# Patient Record
Sex: Female | Born: 1965 | Race: Black or African American | Hispanic: No | Marital: Married | Smoking: Never smoker
Health system: Southern US, Community
[De-identification: ages and names within clinical notes are randomized; demographics above are authoritative.]

## PROBLEM LIST (undated history)

## (undated) DIAGNOSIS — M5430 Sciatica, unspecified side: Secondary | ICD-10-CM

## (undated) DIAGNOSIS — M797 Fibromyalgia: Secondary | ICD-10-CM

## (undated) DIAGNOSIS — I10 Essential (primary) hypertension: Secondary | ICD-10-CM

## (undated) HISTORY — PX: BARIATRIC SURGERY: SHX1103

## (undated) HISTORY — PX: ABDOMINAL HYSTERECTOMY: SHX81

---

## 2017-11-02 ENCOUNTER — Other Ambulatory Visit: Payer: Self-pay | Admitting: Obstetrics and Gynecology

## 2017-11-02 DIAGNOSIS — Z1231 Encounter for screening mammogram for malignant neoplasm of breast: Secondary | ICD-10-CM

## 2018-01-10 ENCOUNTER — Ambulatory Visit
Admission: RE | Admit: 2018-01-10 | Discharge: 2018-01-10 | Disposition: A | Discharge status: Home / Self Care | Payer: BLUE CROSS/BLUE SHIELD | Source: Ambulatory Visit | Attending: Obstetrics and Gynecology | Admitting: Obstetrics and Gynecology

## 2018-01-10 ENCOUNTER — Encounter: Payer: Self-pay | Admitting: Radiology

## 2018-01-10 DIAGNOSIS — Z1231 Encounter for screening mammogram for malignant neoplasm of breast: Secondary | ICD-10-CM | POA: Diagnosis present

## 2018-01-16 ENCOUNTER — Other Ambulatory Visit: Payer: Self-pay | Admitting: *Deleted

## 2018-01-16 ENCOUNTER — Inpatient Hospital Stay
Admission: RE | Admit: 2018-01-16 | Discharge: 2018-01-16 | Disposition: A | Discharge status: Home / Self Care | Payer: Self-pay | Source: Ambulatory Visit | Attending: *Deleted | Admitting: *Deleted

## 2018-01-16 DIAGNOSIS — Z9289 Personal history of other medical treatment: Secondary | ICD-10-CM

## 2018-01-26 ENCOUNTER — Inpatient Hospital Stay
Admission: RE | Admit: 2018-01-26 | Discharge: 2018-01-26 | Disposition: A | Discharge status: Home / Self Care | Payer: Self-pay | Source: Ambulatory Visit | Attending: *Deleted | Admitting: *Deleted

## 2018-01-26 ENCOUNTER — Other Ambulatory Visit: Payer: Self-pay | Admitting: *Deleted

## 2018-01-26 DIAGNOSIS — Z9289 Personal history of other medical treatment: Secondary | ICD-10-CM

## 2018-09-22 ENCOUNTER — Emergency Department
Admission: EM | Admit: 2018-09-22 | Discharge: 2018-09-22 | Disposition: A | Discharge status: Home / Self Care | Payer: BLUE CROSS/BLUE SHIELD | Attending: Emergency Medicine | Admitting: Emergency Medicine

## 2018-09-22 ENCOUNTER — Emergency Department: Payer: BLUE CROSS/BLUE SHIELD

## 2018-09-22 ENCOUNTER — Other Ambulatory Visit: Payer: Self-pay

## 2018-09-22 DIAGNOSIS — S8992XA Unspecified injury of left lower leg, initial encounter: Secondary | ICD-10-CM | POA: Diagnosis present

## 2018-09-22 DIAGNOSIS — S8002XA Contusion of left knee, initial encounter: Secondary | ICD-10-CM | POA: Insufficient documentation

## 2018-09-22 DIAGNOSIS — Y92410 Unspecified street and highway as the place of occurrence of the external cause: Secondary | ICD-10-CM | POA: Insufficient documentation

## 2018-09-22 DIAGNOSIS — Y9389 Activity, other specified: Secondary | ICD-10-CM | POA: Diagnosis not present

## 2018-09-22 DIAGNOSIS — Y998 Other external cause status: Secondary | ICD-10-CM | POA: Insufficient documentation

## 2018-09-22 MED ORDER — NAPROXEN 500 MG PO TABS: 500.0000 mg | ORAL_TABLET | Freq: Two times a day (BID) | ORAL | Status: DC

## 2018-09-22 MED ORDER — TRAMADOL HCL 50 MG PO TABS: 50.0000 mg | ORAL_TABLET | Freq: Two times a day (BID) | ORAL | 0 refills | Status: DC | PRN
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
## 2018-09-22 NOTE — ED Notes (Signed)
See triage note. Pt sitting in bed no distress noted.

## 2018-09-22 NOTE — Discharge Instructions (Signed)
Follow discharge care instructions apply warm compresses to area 2-3 times a day for 5 to 10 minutes at a time.

## 2019-03-31 ENCOUNTER — Emergency Department
Admission: EM | Admit: 2019-03-31 | Discharge: 2019-03-31 | Disposition: A | Discharge status: Home / Self Care | Payer: BLUE CROSS/BLUE SHIELD | Attending: Emergency Medicine | Admitting: Emergency Medicine

## 2019-03-31 ENCOUNTER — Other Ambulatory Visit: Payer: Self-pay

## 2019-03-31 ENCOUNTER — Encounter: Payer: Self-pay | Admitting: Emergency Medicine

## 2019-03-31 ENCOUNTER — Emergency Department: Payer: BLUE CROSS/BLUE SHIELD

## 2019-03-31 DIAGNOSIS — Y92410 Unspecified street and highway as the place of occurrence of the external cause: Secondary | ICD-10-CM | POA: Insufficient documentation

## 2019-03-31 DIAGNOSIS — Y9389 Activity, other specified: Secondary | ICD-10-CM | POA: Insufficient documentation

## 2019-03-31 DIAGNOSIS — R0789 Other chest pain: Secondary | ICD-10-CM | POA: Diagnosis not present

## 2019-03-31 DIAGNOSIS — Y999 Unspecified external cause status: Secondary | ICD-10-CM | POA: Diagnosis not present

## 2019-03-31 DIAGNOSIS — I10 Essential (primary) hypertension: Secondary | ICD-10-CM | POA: Diagnosis not present

## 2019-03-31 DIAGNOSIS — S40012A Contusion of left shoulder, initial encounter: Secondary | ICD-10-CM | POA: Diagnosis not present

## 2019-03-31 DIAGNOSIS — Z79899 Other long term (current) drug therapy: Secondary | ICD-10-CM | POA: Insufficient documentation

## 2019-03-31 DIAGNOSIS — R079 Chest pain, unspecified: Secondary | ICD-10-CM | POA: Diagnosis present

## 2019-03-31 LAB — CBC WITH DIFFERENTIAL/PLATELET
Abs Immature Granulocytes: 0.01 10*3/uL (ref 0.00–0.07)
Basophils Absolute: 0.1 10*3/uL (ref 0.0–0.1)
Basophils Relative: 1 %
Eosinophils Absolute: 0.2 10*3/uL (ref 0.0–0.5)
Eosinophils Relative: 4 %
HCT: 41.5 % (ref 36.0–46.0)
Hemoglobin: 13.5 g/dL (ref 12.0–15.0)
Immature Granulocytes: 0 %
Lymphocytes Relative: 36 %
Lymphs Abs: 1.9 10*3/uL (ref 0.7–4.0)
MCH: 29.3 pg (ref 26.0–34.0)
MCHC: 32.5 g/dL (ref 30.0–36.0)
MCV: 90.2 fL (ref 80.0–100.0)
Monocytes Absolute: 0.3 10*3/uL (ref 0.1–1.0)
Monocytes Relative: 6 %
Neutro Abs: 2.8 10*3/uL (ref 1.7–7.7)
Neutrophils Relative %: 53 %
Platelets: 227 10*3/uL (ref 150–400)
RBC: 4.6 MIL/uL (ref 3.87–5.11)
RDW: 12.3 % (ref 11.5–15.5)
WBC: 5.3 10*3/uL (ref 4.0–10.5)
nRBC: 0 % (ref 0.0–0.2)

## 2019-03-31 LAB — BASIC METABOLIC PANEL
Anion gap: 6 (ref 5–15)
BUN: 18 mg/dL (ref 6–20)
CO2: 28 mmol/L (ref 22–32)
Calcium: 9.1 mg/dL (ref 8.9–10.3)
Chloride: 105 mmol/L (ref 98–111)
Creatinine, Ser: 0.85 mg/dL (ref 0.44–1.00)
GFR calc Af Amer: >60 mL/min (ref >60)
GFR calc non Af Amer: >60 mL/min (ref >60)
Glucose, Bld: 92 mg/dL (ref 70–99)
Potassium: 3.9 mmol/L (ref 3.5–5.1)
Sodium: 139 mmol/L (ref 135–145)

## 2019-03-31 LAB — TROPONIN I: Troponin I: <0.03 ng/mL (ref <0.03)

## 2019-03-31 MED ORDER — IBUPROFEN 600 MG PO TABS: 600.0000 mg | ORAL_TABLET | Freq: Four times a day (QID) | ORAL | 0 refills | Status: DC | PRN

## 2019-03-31 MED ORDER — KETOROLAC TROMETHAMINE 30 MG/ML IJ SOLN
15.0000 mg | Freq: Once | INTRAMUSCULAR | Status: AC
  Administered 2019-03-31: 15 mg via INTRAVENOUS
  Filled 2019-03-31: qty 1

## 2019-03-31 MED ORDER — CYCLOBENZAPRINE HCL 5 MG PO TABS: 5.0000 mg | ORAL_TABLET | Freq: Three times a day (TID) | ORAL | 0 refills | Status: DC | PRN

## 2019-03-31 MED ORDER — CYCLOBENZAPRINE HCL 10 MG PO TABS
5.0000 mg | ORAL_TABLET | Freq: Once | ORAL | Status: AC
  Administered 2019-03-31: 5 mg via ORAL
  Filled 2019-03-31: qty 1
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
## 2019-03-31 NOTE — ED Notes (Signed)
Patient discharged home, paperwork and prescriptions reviewed with questions answered. Patient ambulatory to husband's vehicle.

## 2020-01-22 ENCOUNTER — Ambulatory Visit: Payer: Self-pay | Attending: Oncology

## 2020-01-22 ENCOUNTER — Other Ambulatory Visit: Payer: Self-pay

## 2020-01-22 ENCOUNTER — Ambulatory Visit
Admission: RE | Admit: 2020-01-22 | Discharge: 2020-01-22 | Disposition: A | Discharge status: Home / Self Care | Payer: Self-pay | Source: Ambulatory Visit | Attending: Oncology | Admitting: Oncology

## 2020-01-22 VITALS — BP 108/74 | HR 68 | Temp 99.1°F | Ht 64.5 in | Wt 268.0 lb

## 2020-01-22 DIAGNOSIS — Z0000 Encounter for general adult medical examination without abnormal findings: Secondary | ICD-10-CM | POA: Insufficient documentation

## 2020-01-22 NOTE — Progress Notes (Signed)
Letter mailed from [facility] to notify of normal mammogram results.  Patient to return in one year for annual screening.  Copy to [facility]. 

## 2020-01-22 NOTE — Progress Notes (Signed)
  Subjective:     Patient ID: Patricia Lewis, female   DOB: Jul 08, 1966, 54 y.o.   MRN: [id number]  HPI   Review of Systems     Objective:   Physical Exam Chest:     Breasts: Breasts are asymmetrical.        Right: No swelling, bleeding, inverted nipple, mass, nipple discharge, skin change or tenderness.        Left: No swelling, bleeding, inverted nipple, mass, nipple discharge, skin change or tenderness.     Comments: Right breast smaller than left       Assessment:     54 year old patient presents for [facility] clinic visit.  Patient screened, and meets [facility] eligibility.  Patient does not have insurance, Medicare or Medicaid. Instructed patient on breast self awareness using teach back method.  Clinical breast exam unremarkable..  No mass or lump palpated.  Reviewed 2018 negative /negative pap results in Care Everywhere  with patient .  Risk Assessment    Risk Scores      01/22/2020   Last edited by: Scarlett Presto, RN   5-year risk: 1.2 %   Lifetime risk: 8.5 %            Plan:     Sent for bilateral screening mammogram.

## 2020-04-18 ENCOUNTER — Other Ambulatory Visit: Payer: Self-pay

## 2020-04-18 ENCOUNTER — Ambulatory Visit (INDEPENDENT_AMBULATORY_CARE_PROVIDER_SITE_OTHER): Payer: PRIVATE HEALTH INSURANCE

## 2020-04-18 ENCOUNTER — Ambulatory Visit: Payer: PRIVATE HEALTH INSURANCE

## 2020-04-18 ENCOUNTER — Ambulatory Visit
Admission: EM | Admit: 2020-04-18 | Discharge: 2020-04-18 | Disposition: A | Discharge status: Home / Self Care | Payer: PRIVATE HEALTH INSURANCE

## 2020-04-18 ENCOUNTER — Encounter: Payer: Self-pay | Admitting: Emergency Medicine

## 2020-04-18 DIAGNOSIS — M25562 Pain in left knee: Secondary | ICD-10-CM

## 2020-04-18 HISTORY — DX: Essential (primary) hypertension: I10

## 2020-04-18 HISTORY — DX: Fibromyalgia: M79.7

## 2020-04-18 HISTORY — DX: Sciatica, unspecified side: M54.30

## 2020-04-18 MED ORDER — LIDOCAINE HCL (PF) 1 % IJ SOLN
5.0000 mL | Freq: Once | INTRAMUSCULAR | Status: AC
  Administered 2020-04-18: 20:00:00 5 mL

## 2020-04-18 MED ORDER — TRIAMCINOLONE ACETONIDE 40 MG/ML IJ SUSP
40.0000 mg | Freq: Once | INTRAMUSCULAR | Status: AC
  Administered 2020-04-18: 40 mg via INTRA_ARTICULAR

## 2020-04-18 MED ORDER — TRAMADOL HCL 50 MG PO TABS: 50.0000 mg | ORAL_TABLET | Freq: Four times a day (QID) | ORAL | 0 refills | Status: AC | PRN

## 2020-04-18 MED ORDER — MELOXICAM 15 MG PO TABS: 15.0000 mg | ORAL_TABLET | Freq: Every day | ORAL | 0 refills | Status: AC
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
## 2020-04-18 NOTE — ED Triage Notes (Signed)
Patient c/o left knee pain and swelling off and on for a week.  Patient states she has noticed some redness in her left knee.  Patient denies injury or fall.

## 2020-04-18 NOTE — Discharge Instructions (Addendum)
Take the meloxicam every day  You may take ibuprofen OR aleve as needed for pain in addition to the tramadol  You may feel a little more pain, swelling, and warmth than you did before the injection, which may last about 1-2 days. It is important to ice your knee for 20 minutes, 2-3 times per day for the next couple of days  Wear the knee sleeve when up for comfort. You may remove it at night and when bathing.  Follow-up with orthopedics. Call on Monday to make appointment. You may contact the one suggested by [place] here or choose your own physician.   It was my absolute pleasure taking care of you today!!!  I hope you feel better soon Lelon Mast, FNP-C

## 2020-11-09 ENCOUNTER — Other Ambulatory Visit: Payer: Self-pay

## 2020-11-09 ENCOUNTER — Emergency Department
Admission: EM | Admit: 2020-11-09 | Discharge: 2020-11-09 | Disposition: A | Discharge status: Home / Self Care | Payer: PRIVATE HEALTH INSURANCE | Attending: Emergency Medicine | Admitting: Emergency Medicine

## 2020-11-09 ENCOUNTER — Emergency Department: Payer: PRIVATE HEALTH INSURANCE

## 2020-11-09 ENCOUNTER — Encounter: Payer: Self-pay | Admitting: Emergency Medicine

## 2020-11-09 DIAGNOSIS — Z79899 Other long term (current) drug therapy: Secondary | ICD-10-CM | POA: Insufficient documentation

## 2020-11-09 DIAGNOSIS — R079 Chest pain, unspecified: Secondary | ICD-10-CM | POA: Diagnosis present

## 2020-11-09 DIAGNOSIS — I10 Essential (primary) hypertension: Secondary | ICD-10-CM | POA: Diagnosis not present

## 2020-11-09 DIAGNOSIS — R0602 Shortness of breath: Secondary | ICD-10-CM | POA: Diagnosis not present

## 2020-11-09 DIAGNOSIS — R0789 Other chest pain: Secondary | ICD-10-CM | POA: Diagnosis not present

## 2020-11-09 LAB — BASIC METABOLIC PANEL
Anion gap: 8 (ref 5–15)
BUN: 18 mg/dL (ref 6–20)
CO2: 27 mmol/L (ref 22–32)
Calcium: 9.3 mg/dL (ref 8.9–10.3)
Chloride: 104 mmol/L (ref 98–111)
Creatinine, Ser: 0.89 mg/dL (ref 0.44–1.00)
GFR, Estimated: >60 mL/min (ref >60)
Glucose, Bld: 98 mg/dL (ref 70–99)
Potassium: 3.5 mmol/L (ref 3.5–5.1)
Sodium: 139 mmol/L (ref 135–145)

## 2020-11-09 LAB — CBC
HCT: 43.7 % (ref 36.0–46.0)
Hemoglobin: 14.1 g/dL (ref 12.0–15.0)
MCH: 29.1 pg (ref 26.0–34.0)
MCHC: 32.3 g/dL (ref 30.0–36.0)
MCV: 90.1 fL (ref 80.0–100.0)
Platelets: 235 10*3/uL (ref 150–400)
RBC: 4.85 MIL/uL (ref 3.87–5.11)
RDW: 12.7 % (ref 11.5–15.5)
WBC: 5.5 10*3/uL (ref 4.0–10.5)
nRBC: 0 % (ref 0.0–0.2)

## 2020-11-09 LAB — TROPONIN I (HIGH SENSITIVITY)
Troponin I (High Sensitivity): <2 ng/L (ref <18)
Troponin I (High Sensitivity): <2 ng/L (ref <18)

## 2020-11-09 NOTE — ED Triage Notes (Signed)
Pt reports intermittent sharp chest pain that started last pm to her mid to left chest. Pt reports some SOB with the pain

## 2020-11-09 NOTE — Discharge Instructions (Signed)
Please take Tylenol and ibuprofen/Advil for your pain.  It is safe to take them together, or to alternate them every few hours.  Take up to 1000mg  of Tylenol at a time, up to 4 times per day.  Do not take more than 4000 mg of Tylenol in 24 hours.  For ibuprofen, take 400-600 mg, 4-5 times per day.  As we discussed, there is no evidence of strain or damage to your heart.  Your pain is likely due to the lifting you were doing yesterday causing some tightness.  If you develop any worsening chest pains, particularly in combination with fevers or passing out, please return to the ED.

## 2020-11-09 NOTE — ED Provider Notes (Signed)
[facility] Emergency Department Provider Note ____________________________________________   First MD Initiated Contact with Patient 11/09/20 1455     (approximate)  I have reviewed the triage vital signs and the nursing notes.  HISTORY  Chief Complaint Chest Pain and Shortness of Breath   HPI Patricia Lewis is a 54 y.o. femalewho presents to the ED for evaluation of chest pain shortness of breath.  Chart review indicates history of hypertension and fibromyalgia.  Patient reports that she has never seen a cardiologist and has no cardiac medical history.  Denies smoking cigarettes.  No family history of early cardiac death.  She reports being in her typical state of health until developing sharp left-sided chest pains that occurred last night.  She reports the pain was up to 6/10 intensity, sharp in nature and nonradiating.  Lasting a matter of minutes before self resolving has not recurred.  She further reports soreness to the full length of the flexor surface of her left arm that is been constant and present for the past 12 hours straight, 4/10 intensity.  She reports doing work around the house yesterday and lifting heavy portion of her fireplace insert.  She denies any chest pain or symptoms with exertion while doing this housework.  She denies any chest pain right now says she feels better.  She has not taken any medications prior to arrival.  No fevers, syncopal episodes, shortness of breath, emesis or diaphoresis.  Past Medical History:  Diagnosis Date  . Fibromyalgia   . Hypertension   . Sciatica     There are no problems to display for this patient.   Past Surgical History:  Procedure Laterality Date  . ABDOMINAL HYSTERECTOMY      Prior to Admission medications   Medication Sig Start Date End Date Taking? Authorizing Provider  amitriptyline (ELAVIL) 25 MG tablet  06/24/18   [provider]  DULoxetine (CYMBALTA) 30 MG capsule  04/09/20    [provider]  gabapentin (NEURONTIN) 300 MG capsule Take by mouth. 09/13/18   [provider]  hydrochlorothiazide (MICROZIDE) 12.5 MG capsule Take 12.5 mg by mouth daily. 11/06/19   [provider]  ibuprofen (ADVIL) 600 MG tablet Take 1 tablet (600 mg total) by mouth every 6 (six) hours as needed. 03/31/19   Nita Sickle, MD  naproxen (NAPROSYN) 500 MG tablet Take 1 tablet (500 mg total) by mouth 2 (two) times daily with a meal. 09/22/18   Joni Reining, PA-C  phentermine 37.5 MG capsule Take 37.5 mg by mouth every morning.    [provider]  tiZANidine (ZANAFLEX) 4 MG tablet Take 4 mg by mouth 3 (three) times daily. 02/12/20   [provider]  traMADol (ULTRAM) 50 MG tablet Take 1 tablet (50 mg total) by mouth every 6 (six) hours as needed. 04/18/20   Lurline Idol, FNP    Allergies Avocado  Family History  Problem Relation Age of Onset  . Breast cancer Maternal Aunt 40  . Hypertension Mother   . Cancer Father     Social History Social History   Tobacco Use  . Smoking status: Never Smoker  . Smokeless tobacco: Never Used  Vaping Use  . Vaping Use: Never used  Substance Use Topics  . Alcohol use: Never  . Drug use: Never    Review of Systems  Constitutional: No fever/chills Eyes: No visual changes. ENT: No sore throat. Cardiovascular: Positive for chest pain. Respiratory: Denies shortness of breath. Gastrointestinal: No abdominal  pain.  No nausea, no vomiting.  No diarrhea.  No constipation. Genitourinary: Negative for dysuria. Musculoskeletal: Negative for back pain.  Positive for left arm soreness. Skin: Negative for rash. Neurological: Negative for headaches, focal weakness or numbness.  ____________________________________________   PHYSICAL EXAM:  VITAL SIGNS: Vitals:   11/09/20 1527 11/09/20 1737  BP: 135/78 (!) 144/78  Pulse: 65 88  Resp: 17 17  Temp: 98.6 F (37 C) 98 F (36.7 C)  SpO2: 100%  98%     Constitutional: Alert and oriented. Well appearing and in no acute distress. Eyes: Conjunctivae are normal. PERRL. EOMI. Head: Atraumatic. Nose: No congestion/rhinnorhea. Mouth/Throat: Mucous membranes are moist.  Oropharynx non-erythematous. Neck: No stridor. No cervical spine tenderness to palpation. Cardiovascular: Normal rate, regular rhythm. Grossly normal heart sounds.  Good peripheral circulation. Respiratory: Normal respiratory effort.  No retractions. Lungs CTAB. Gastrointestinal: Soft , nondistended, nontender to palpation. No CVA tenderness. Musculoskeletal: No lower extremity tenderness nor edema.  No joint effusions. No signs of acute trauma. Flexion against resistance to the left upper extremity reproduces her aching pain.  No swelling, signs of trauma, induration or fluctuance noted throughout the left upper extremity. Neurologic:  Normal speech and language. No gross focal neurologic deficits are appreciated. No gait instability noted. Skin:  Skin is warm, dry and intact. No rash noted. Psychiatric: Mood and affect are normal. Speech and behavior are normal.  ____________________________________________   LABS (all labs ordered are listed, but only abnormal results are displayed)  Labs Reviewed  BASIC METABOLIC PANEL  CBC  POC URINE PREG, ED  TROPONIN I (HIGH SENSITIVITY)  TROPONIN I (HIGH SENSITIVITY)   ____________________________________________  12 Lead EKG  Sinus rhythm, rate of 61 bpm.  Normal axis and intervals.  No evidence of acute ischemia. ____________________________________________  RADIOLOGY  ED MD interpretation: 2 view CXR reviewed by me without evidence of acute cardiopulmonary pathology.  Official radiology report(s): DG Chest 2 View  Result Date: 11/09/2020 CLINICAL DATA:  Chest pain. EXAM: CHEST - 2 VIEW COMPARISON:  March 31, 2019 FINDINGS: Cardiomediastinal silhouette is normal. Mediastinal contours appear intact. There is no  evidence of focal airspace consolidation, pleural effusion or pneumothorax. Osseous structures are without acute abnormality. Soft tissues are grossly normal. IMPRESSION: No active cardiopulmonary disease. Electronically Signed   By: Ted Mcalpine M.D.   On: 11/09/2020 14:56    ____________________________________________   PROCEDURES and INTERVENTIONS  Procedure(s) performed (including Critical Care):  .1-3 Lead EKG Interpretation Performed by: Delton Prairie, MD Authorized by: Delton Prairie, MD     Interpretation: normal     ECG rate:  64   ECG rate assessment: normal     Rhythm: sinus rhythm     Ectopy: none     Conduction: normal      Medications - No data to display  ____________________________________________   MDM / ED COURSE   Low risk 54 year old patient presents to the ED with intermittent chest pain that occurred last night, without evidence of cardiac ischemia, and amenable to outpatient management.  Normal vitals on room air.  Exam reassuring without evidence of acute derangements.  She is in no acute distress, has no evidence of neurovascular deficits or signs of trauma.  Work-up is quite reassuring without evidence of any acute derangements.  Her EKG is nonischemic and has negative high-sensitivity troponin x2.  CXR without infiltrate or PTX.  Continues to be chest pain-free here in the ED.  We discussed return precautions for the ED and patient is medically  stable for discharge home.  Clinical Course as of Nov 09 1741  Wynelle Link Nov 09, 2020  1724 Reassessed.  Patient continues to be chest pain-free.  We discussed reassuring work-up and outpatient management.  We discussed return precautions for the ED.   [DS]    Clinical Course User Index [DS] Delton Prairie, MD    ____________________________________________   FINAL CLINICAL IMPRESSION(S) / ED DIAGNOSES  Final diagnoses:  Other chest pain     ED Discharge Orders    None       Goldie Tregoning  Katrinka Blazing   Note:  This document was prepared using Dragon voice recognition software and may include unintentional dictation errors.   Delton Prairie, MD 11/09/20 1743

## 2021-02-06 ENCOUNTER — Other Ambulatory Visit: Payer: Self-pay | Admitting: Family Medicine

## 2021-02-06 DIAGNOSIS — Z1231 Encounter for screening mammogram for malignant neoplasm of breast: Secondary | ICD-10-CM

## 2021-02-19 ENCOUNTER — Ambulatory Visit: Payer: PRIVATE HEALTH INSURANCE

## 2021-03-05 ENCOUNTER — Ambulatory Visit
Admission: RE | Admit: 2021-03-05 | Discharge: 2021-03-05 | Disposition: A | Discharge status: Home / Self Care | Payer: Commercial Managed Care - HMO | Source: Ambulatory Visit | Attending: Family Medicine | Admitting: Family Medicine

## 2021-03-05 ENCOUNTER — Other Ambulatory Visit: Payer: Self-pay

## 2021-03-05 DIAGNOSIS — Z1231 Encounter for screening mammogram for malignant neoplasm of breast: Secondary | ICD-10-CM | POA: Diagnosis not present

## 2021-06-29 ENCOUNTER — Emergency Department
Admission: EM | Admit: 2021-06-29 | Discharge: 2021-06-29 | Disposition: A | Discharge status: Home / Self Care | Payer: Commercial Managed Care - HMO | Attending: Emergency Medicine | Admitting: Emergency Medicine

## 2021-06-29 ENCOUNTER — Emergency Department: Payer: Commercial Managed Care - HMO

## 2021-06-29 ENCOUNTER — Other Ambulatory Visit: Payer: Self-pay

## 2021-06-29 DIAGNOSIS — M79662 Pain in left lower leg: Secondary | ICD-10-CM | POA: Diagnosis present

## 2021-06-29 DIAGNOSIS — Z79899 Other long term (current) drug therapy: Secondary | ICD-10-CM | POA: Insufficient documentation

## 2021-06-29 DIAGNOSIS — I10 Essential (primary) hypertension: Secondary | ICD-10-CM | POA: Insufficient documentation

## 2021-06-29 DIAGNOSIS — L819 Disorder of pigmentation, unspecified: Secondary | ICD-10-CM | POA: Insufficient documentation

## 2021-06-29 MED ORDER — BETAMETHASONE DIPROPIONATE 0.05 % EX CREA: TOPICAL_CREAM | Freq: Two times a day (BID) | CUTANEOUS | 0 refills | Status: AC

## 2021-06-29 NOTE — ED Triage Notes (Signed)
Pt had bariatric surgery 8 weeks ago and soon after noted some lines of discoloration to left lower leg that have since worsened. Denies any pain or swelling.

## 2021-06-29 NOTE — ED Notes (Signed)
Spoke with Dr. Darnelle Catalan and verbal order given for [place].

## 2021-06-29 NOTE — Discharge Instructions (Addendum)
As we discussed, try using compression stockings and steroid cream to this area and follow-up with your PCP.  No signs of blood clots to this leg.

## 2021-06-29 NOTE — ED Provider Notes (Signed)
[facility] Emergency Department Provider Note ____________________________________________   Event Date/Time   First MD Initiated Contact with Patient 06/29/21 2042     (approximate)  I have reviewed the triage vital signs and the nursing notes.  HISTORY  Chief Complaint Leg Pain   HPI Patricia Lewis is a 55 y.o. femalewho presents to the ED for evaluation of skin discoloration.  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  Patient presents to the ED for evaluation of discoloration of the skin to her left lower leg.  She denies any pain or itching to this area, denies any trauma or injuries.  She reports this has been present since she left the hospital nearly 7 or 8 weeks ago.  She reports that it seems a little bit bigger or darker now and just wanted to get it checked out.  Denies any symptoms to the area and says it just looks strange.  Past Medical History:  Diagnosis Date   Fibromyalgia    Hypertension    Sciatica     There are no problems to display for this patient.   Past Surgical History:  Procedure Laterality Date   ABDOMINAL HYSTERECTOMY      Prior to Admission medications   Medication Sig Start Date End Date Taking? Authorizing Provider  betamethasone dipropionate 0.05 % cream Apply topically 2 (two) times daily. 06/29/21  Yes Delton Prairie, MD  amitriptyline (ELAVIL) 25 MG tablet  06/24/18   [provider]  DULoxetine (CYMBALTA) 30 MG capsule  04/09/20   [provider]  gabapentin (NEURONTIN) 300 MG capsule Take by mouth. 09/13/18   [provider]  hydrochlorothiazide (MICROZIDE) 12.5 MG capsule Take 12.5 mg by mouth daily. 11/06/19   [provider]  ibuprofen (ADVIL) 600 MG tablet Take 1 tablet (600 mg total) by mouth every 6 (six) hours as needed. 03/31/19   Nita Sickle, MD  naproxen (NAPROSYN)  500 MG tablet Take 1 tablet (500 mg total) by mouth 2 (two) times daily with a meal. 09/22/18   Joni Reining, PA-C  phentermine 37.5 MG capsule Take 37.5 mg by mouth every morning.    [provider]  tiZANidine (ZANAFLEX) 4 MG tablet Take 4 mg by mouth 3 (three) times daily. 02/12/20   [provider]  traMADol (ULTRAM) 50 MG tablet Take 1 tablet (50 mg total) by mouth every 6 (six) hours as needed. 04/18/20   Lurline Idol, FNP    Allergies Avocado  Family History  Problem Relation Age of Onset   Breast cancer Maternal Aunt 37   Hypertension Mother    Cancer Father     Social History Social History   Tobacco Use   Smoking status: Never   Smokeless tobacco: Never  Vaping Use   Vaping Use: Never used  Substance Use Topics   Alcohol use: Never   Drug use: Never    Review of Systems  Constitutional: No fever/chills Eyes: No visual changes. ENT: No sore throat. Cardiovascular: Denies chest pain. Respiratory: Denies shortness of breath. Gastrointestinal: No abdominal pain.  No nausea, no vomiting.  No diarrhea.  No constipation. Genitourinary: Negative for dysuria. Musculoskeletal: Negative for back pain. Skin: Positive skin discoloration Neurological: Negative for headaches, focal weakness or numbness.   ____________________________________________   PHYSICAL EXAM:  VITAL SIGNS: Vitals:   06/29/21 2026 06/29/21 2150  BP: 115/64 125/72  Pulse: (!) 54 (!) 55  Resp: 20 18  Temp: 98.4 F (36.9 C)   SpO2: 99% 98%     Constitutional: Alert and oriented. Well appearing and in no acute distress.  Obese.  Pleasant and conversational in full sentences. Eyes: Conjunctivae are normal. PERRL. EOMI. Head: Atraumatic. Nose: No congestion/rhinnorhea. Mouth/Throat: Mucous membranes are moist.  Oropharynx non-erythematous. Neck: No stridor. No cervical spine tenderness to palpation. Cardiovascular: Normal rate, regular rhythm. Grossly normal heart  sounds.  Good peripheral circulation. Respiratory: Normal respiratory effort.  No retractions. Lungs CTAB. Gastrointestinal: Soft , nondistended, nontender to palpation. No CVA tenderness. Musculoskeletal: No lower extremity tenderness nor edema.  No joint effusions. No signs of acute trauma. Neurologic:  Normal speech and language. No gross focal neurologic deficits are appreciated. No gait instability noted. Skin:  Skin is warm, dry and intact.   Flat discoloration to the medial aspect of the left lower leg as pictured below.  Wylene Men.  Appears like flat bruising.  Nontender to palpation.  No excoriations, lacerations, signs of discrete trauma.  No calf tenderness in the calf is soft.  Nothing extends beyond the ankle or proximally beyond the knee.  Psychiatric: Mood and affect are normal. Speech and behavior are normal.    ____________________________________________   LABS (all labs ordered are listed, but only abnormal results are displayed)  Labs Reviewed - No data to display ____________________________________________  12 Lead EKG   ____________________________________________  RADIOLOGY  ED MD interpretation:    Official radiology report(s): US Venous Img Lower Unilateral Left  Result Date: 06/29/2021 CLINICAL DATA:  Left lower extremity discoloration EXAM: LEFT LOWER EXTREMITY VENOUS DOPPLER ULTRASOUND TECHNIQUE: Gray-scale sonography with compression, as well as color and duplex ultrasound, were performed to evaluate the deep venous system(s) from the level of the common femoral vein through the popliteal and proximal calf veins. COMPARISON:  None. FINDINGS: VENOUS Normal compressibility of the common femoral, superficial femoral, and popliteal veins, as well as the visualized calf veins. Visualized portions of profunda femoral vein and great saphenous vein unremarkable. No filling defects to suggest DVT on grayscale or color Doppler imaging. Doppler waveforms show normal  direction of venous flow, normal respiratory plasticity and response to augmentation. Limited views of the contralateral common femoral vein are unremarkable. OTHER Limited sonographic images of the soft tissues in the region of discoloration demonstrate no abnormal subcutaneous soft tissue mass, fluid collection, or calcification. Limitations: none IMPRESSION: Negative. Electronically Signed   By: Helyn Numbers MD   On: 06/29/2021 21:41    ____________________________________________   PROCEDURES and INTERVENTIONS  Procedure(s) performed (including Critical Care):  Procedures  Medications - No data to display  ____________________________________________   MDM / ED COURSE   Obese 55 year old female presents to the ED with skin discoloration of uncertain etiology, amenable to outpatient management.  Normal vitals.  Patient clinically looks well.  She has discolored skin that looks similar to a bruise, but has lacy distribution.  Ultrasound demonstrates no evidence of DVT, cobblestoning to suggest cellulitis or underlying abscess.  She has no symptoms to this area such as pain or pruritus.  Uncertain etiology of the symptoms, and I discussed with the patient a trial of outpatient management with conservative measures such as compression stockings, steroid cream and following up with her PCP.  Return precautions were discussed.      ____________________________________________   FINAL CLINICAL IMPRESSION(S) / ED DIAGNOSES  Final diagnoses:  Discoloration of skin of lower leg     ED Discharge Orders  Ordered    betamethasone dipropionate 0.05 % cream  2 times daily        06/29/21 2216             Amaurie Schreckengost   Note:  This document was prepared using Conservation officer, historic buildings and may include unintentional dictation errors.    Delton Prairie, MD 06/29/21 2222

## 2021-06-30 ENCOUNTER — Ambulatory Visit: Payer: Commercial Managed Care - HMO

## 2021-12-24 DIAGNOSIS — R1013 Epigastric pain: Secondary | ICD-10-CM | POA: Diagnosis not present

## 2022-02-05 DIAGNOSIS — Z713 Dietary counseling and surveillance: Secondary | ICD-10-CM | POA: Diagnosis not present

## 2022-02-05 DIAGNOSIS — Z9884 Bariatric surgery status: Secondary | ICD-10-CM | POA: Diagnosis not present

## 2022-02-05 DIAGNOSIS — Z6832 Body mass index (BMI) 32.0-32.9, adult: Secondary | ICD-10-CM | POA: Diagnosis not present

## 2022-02-05 DIAGNOSIS — K828 Other specified diseases of gallbladder: Secondary | ICD-10-CM | POA: Diagnosis not present

## 2022-03-02 ENCOUNTER — Other Ambulatory Visit: Payer: Self-pay | Admitting: Family Medicine

## 2022-03-02 DIAGNOSIS — Z1231 Encounter for screening mammogram for malignant neoplasm of breast: Secondary | ICD-10-CM

## 2022-03-03 ENCOUNTER — Ambulatory Visit: Payer: Commercial Managed Care - HMO

## 2022-03-17 ENCOUNTER — Ambulatory Visit: Payer: Commercial Managed Care - PPO

## 2022-03-17 ENCOUNTER — Ambulatory Visit: Payer: Self-pay

## 2022-03-18 ENCOUNTER — Telehealth: Payer: Self-pay

## 2022-03-18 ENCOUNTER — Ambulatory Visit (INDEPENDENT_AMBULATORY_CARE_PROVIDER_SITE_OTHER): Payer: 59

## 2022-03-18 ENCOUNTER — Ambulatory Visit
Admission: RE | Admit: 2022-03-18 | Discharge: 2022-03-18 | Disposition: A | Discharge status: Home / Self Care | Payer: Commercial Managed Care - PPO | Source: Ambulatory Visit | Attending: Internal Medicine | Admitting: Internal Medicine

## 2022-03-18 VITALS — BP 124/77 | HR 56 | Temp 97.7°F | Resp 16

## 2022-03-18 DIAGNOSIS — M25511 Pain in right shoulder: Secondary | ICD-10-CM | POA: Diagnosis not present

## 2022-03-18 DIAGNOSIS — M19011 Primary osteoarthritis, right shoulder: Secondary | ICD-10-CM | POA: Diagnosis not present

## 2022-03-18 MED ORDER — MELOXICAM 7.5 MG PO TABS: 7.5000 mg | ORAL_TABLET | Freq: Every day | ORAL | 0 refills | Status: AC

## 2022-03-18 MED ORDER — MELOXICAM 7.5 MG PO TABS: 7.5000 mg | ORAL_TABLET | Freq: Every day | ORAL | 0 refills | Status: DC

## 2022-03-18 NOTE — ED Triage Notes (Signed)
Patient presents to Urgent Care with complaints of right arm pain x 2 weeks. Pain worse with lifting. Pt states no injury. She has a hx of arthritis. Not taking any medications.  ?

## 2022-03-18 NOTE — ED Provider Notes (Addendum)
?[facility] ? ? ? ?CSN: [id number] ?Arrival date & time: 03/18/22  [id number] ? ? ?  ? ?History   ?Chief Complaint ?Chief Complaint  ?Patient presents with  ? Arm Injury  ?  Pain in right bicep and shoulder can barely lift it - Entered by patient  ? ? ?HPI ?Patricia Lewis is a 56 y.o. female who presents with gradual onset of R shoulder and upper arm pain x 2 weeks. Pain is provoked with lifting arm up. Denies injuring herself in any way. Has hx of bilateral knee OA. Has xrays of R shoulder 15 years ago, and was normal.  ? ? ? ?Past Medical History:  ?Diagnosis Date  ? Fibromyalgia   ? Hypertension   ? Sciatica   ? ? ?There are no problems to display for this patient. ? ? ?Past Surgical History:  ?Procedure Laterality Date  ? ABDOMINAL HYSTERECTOMY    ? ? ?OB History   ?No obstetric history on file. ?  ? ? ? ?Home Medications   ? ?Prior to Admission medications   ?Medication Sig Start Date End Date Taking? Authorizing Provider  ?meloxicam (MOBIC) 7.5 MG tablet Take 1 tablet (7.5 mg total) by mouth daily. 03/18/22  Yes Rodriguez-Southworth, Sunday Spillers, PA-C  ?amitriptyline (ELAVIL) 25 MG tablet  06/24/18   [provider]  ?betamethasone dipropionate 0.05 % cream Apply topically 2 (two) times daily. 06/29/21   Vladimir Crofts, MD  ?DULoxetine (CYMBALTA) 30 MG capsule  04/09/20   [provider]  ?hydrochlorothiazide (MICROZIDE) 12.5 MG capsule Take 12.5 mg by mouth daily. 11/06/19   [provider]  ?phentermine 37.5 MG capsule Take 37.5 mg by mouth every morning.    [provider]  ?tiZANidine (ZANAFLEX) 4 MG tablet Take 4 mg by mouth 3 (three) times daily. 02/12/20   [provider]  ?traMADol (ULTRAM) 50 MG tablet Take 1 tablet (50 mg total) by mouth every 6 (six) hours as needed. 04/18/20   Enrique Sack, FNP  ? ? ?Family History ?Family History  ?Problem Relation Age of Onset  ? Breast cancer Maternal Aunt 62  ? Hypertension Mother   ? Cancer Father   ? ? ?Social History ?Social  History  ? ?Tobacco Use  ? Smoking status: Never  ? Smokeless tobacco: Never  ?Vaping Use  ? Vaping Use: Never used  ?Substance Use Topics  ? Alcohol use: Never  ? Drug use: Never  ? ? ? ?Allergies   ?Avocado ? ? ?Review of Systems ?Review of Systems  ?Musculoskeletal:  Positive for arthralgias. Negative for neck pain and neck stiffness.  ? ? ?Physical Exam ?Triage Vital Signs ?ED Triage Vitals  ?Enc Vitals Group  ?   BP 03/18/22 0851 124/77  ?   Pulse Rate 03/18/22 0851 (!) 56  ?   Resp 03/18/22 0851 16  ?   Temp 03/18/22 0851 97.7 ?F (36.5 ?C)  ?   Temp Source 03/18/22 0851 Temporal  ?   SpO2 03/18/22 0851 98 %  ?   Weight --   ?   Height --   ?   Head Circumference --   ?   Peak Flow --   ?   Pain Score 03/18/22 0852 10  ?   Pain Loc --   ?   Pain Edu? --   ?   Excl. in [place]? --   ? ?No data found. ? ?Updated Vital Signs ?BP 124/77 (BP Location: Left Arm)   Pulse (!) 56  Temp 97.7 ?F (36.5 ?C) (Temporal)   Resp 16   SpO2 98%  ? ?Visual Acuity ?Right Eye Distance:   ?Left Eye Distance:   ?Bilateral Distance:   ? ?Right Eye Near:   ?Left Eye Near:    ?Bilateral Near:    ? ?Physical Exam ?Vitals and nursing note reviewed.  ?Constitutional:   ?   General: She is not in acute distress. ?   Appearance: She is not toxic-appearing.  ?HENT:  ?   Right Ear: External ear normal.  ?   Left Ear: External ear normal.  ?Eyes:  ?   General: No scleral icterus. ?   Conjunctiva/sclera: Conjunctivae normal.  ?Musculoskeletal:     ?   General: No swelling, deformity or signs of injury.  ?   Cervical back: Neck supple.  ?   Comments: Has local tenderness on bicep tendon region and deltoid with palpation. But does not have any pain with palpation of soft tissue on humerus. Pain provoked with anterior arm extension to 70 degrees and laterally to 60 degrees. Has pain reaching to her back.   ?Neurological:  ?   Mental Status: She is alert and oriented to person, place, and time.  ?   Motor: No weakness.  ?   Gait: Gait normal.  ?    Deep Tendon Reflexes: Reflexes normal.  ?Psychiatric:     ?   Mood and Affect: Mood normal.     ?   Behavior: Behavior normal.     ?   Thought Content: Thought content normal.     ?   Judgment: Judgment normal.  ? ? ? ?UC Treatments / Results  ?Labs ?(all labs ordered are listed, but only abnormal results are displayed) ?Labs Reviewed - No data to display ? ?EKG ? ? ?Radiology ?DG Shoulder Right ? ?Result Date: 03/18/2022 ?CLINICAL DATA:  Pain EXAM: RIGHT SHOULDER - 3 VIEW COMPARISON:  None. FINDINGS: No recent fracture or dislocation is seen. Small bony spurs seen in the shoulder joint. Degenerative changes are noted in the right [facility] joint. There are no abnormal soft tissue calcifications. IMPRESSION: No recent fracture or dislocation is seen in the right shoulder. Degenerative changes are noted in the right shoulder and AC joints with bony spurs. Electronically Signed   By: Elmer Picker M.D.   On: 03/18/2022 09:30   ? ?Procedures ?Procedures (including critical care time) ? ?Medications Ordered in UC ?Medications - No data to display ? ?Initial Impression / Assessment and Plan / UC Course  ?I have reviewed the triage vital signs and the nursing notes. ? ?Pertinent  imaging results that were available during my care of the patient were reviewed by me and considered in my medical decision making (see chart for details). ? ? ?OA R shoulder ? ?I placed her on Mobic and she will follow with her ortho in 1 week.  ?She was educated how to do wall stretches to prevent frozen shoulder ? ? ?Final Clinical Impressions(s) / UC Diagnoses  ? ?Final diagnoses:  ?Osteoarthritis of right acromioclavicular joint  ? ? ? ?Discharge Instructions   ? ?  ?Follow up with  with ortho in the next week.  ? ? ? ? ?ED Prescriptions   ? ? Medication Sig Dispense Auth. Provider  ? meloxicam (MOBIC) 7.5 MG tablet Take 1 tablet (7.5 mg total) by mouth daily. 14 tablet Rodriguez-Southworth, Sunday Spillers, PA-C  ? ?  ? ?PDMP not reviewed this  encounter. ?  ?Rodriguez-Southworth, Sunday Spillers, PA-C ?  03/18/22 0945 ? ?  ?Shelby Mattocks, PA-C ?03/18/22 [id number] ? ?

## 2022-03-18 NOTE — Telephone Encounter (Signed)
Patient called [place] urgent care to switch mediation from [facility] to [employer].  ?

## 2022-03-18 NOTE — Discharge Instructions (Signed)
Follow up with  with ortho in the next week.  ?

## 2022-04-28 ENCOUNTER — Ambulatory Visit
Admission: RE | Admit: 2022-04-28 | Discharge: 2022-04-28 | Disposition: A | Discharge status: Home / Self Care | Payer: Medicare Other | Source: Ambulatory Visit | Attending: Family Medicine | Admitting: Family Medicine

## 2022-04-28 DIAGNOSIS — Z1231 Encounter for screening mammogram for malignant neoplasm of breast: Secondary | ICD-10-CM | POA: Diagnosis not present

## 2022-06-21 ENCOUNTER — Emergency Department
Admission: EM | Admit: 2022-06-21 | Discharge: 2022-06-21 | Discharge status: Against Medical Advice | Payer: Medicare Other | Source: Home / Self Care

## 2022-06-22 ENCOUNTER — Ambulatory Visit: Payer: Medicare Other

## 2022-07-21 IMAGING — MG MM DIGITAL SCREENING BILAT W/ TOMO AND CAD
8 series · 8 of 24 positions shown · non-contrast
Comparison: Previous exam(s).

CLINICAL DATA: Screening.

EXAM:
DIGITAL SCREENING BILATERAL MAMMOGRAM WITH TOMOSYNTHESIS AND CAD
TECHNIQUE: Bilateral screening digital craniocaudal and mediolateral oblique
mammograms were obtained. Bilateral screening digital breast
tomosynthesis was performed. The images were evaluated with
computer-aided detection.

[L MLO synth-2D]
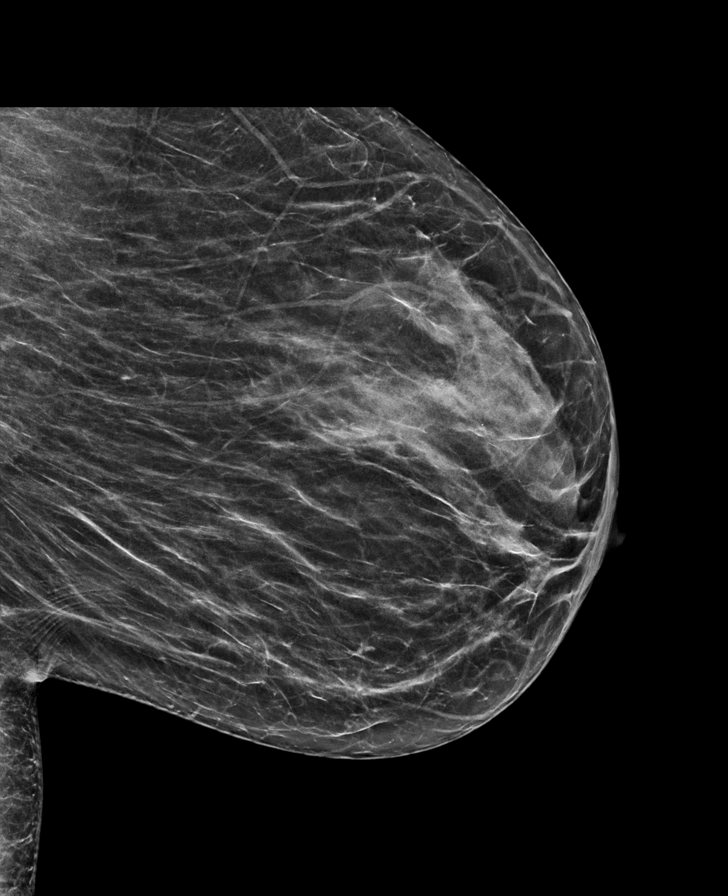

[R CC synth-2D]
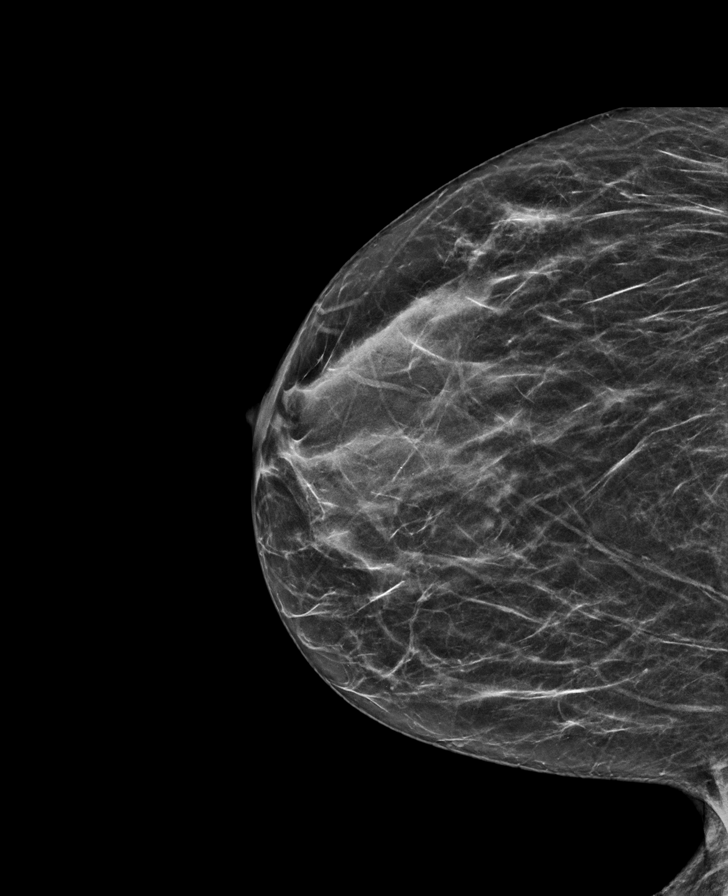

[R MLO synth-2D]
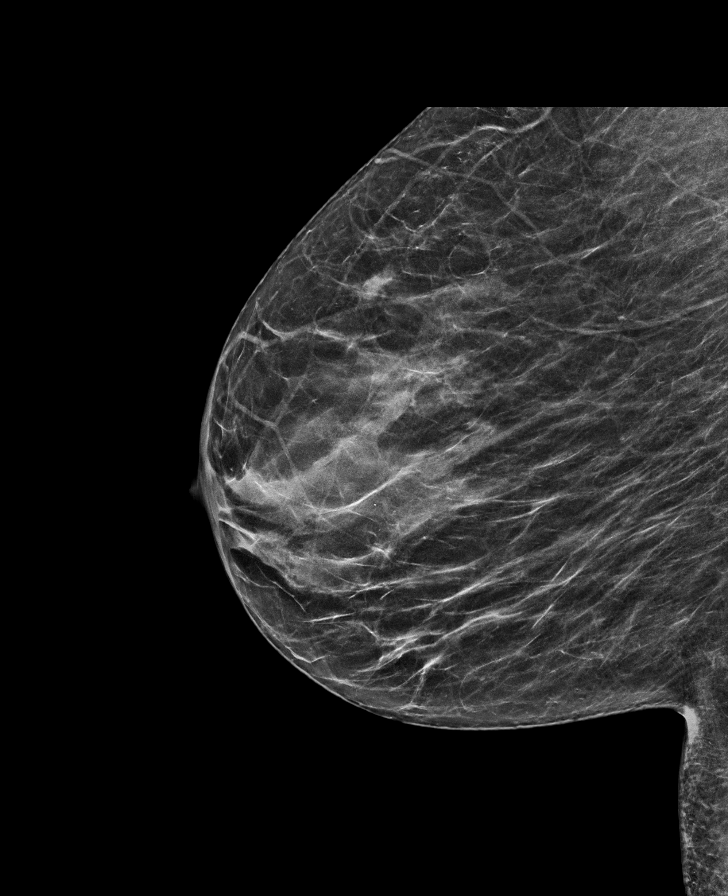

[L CC synth-2D]
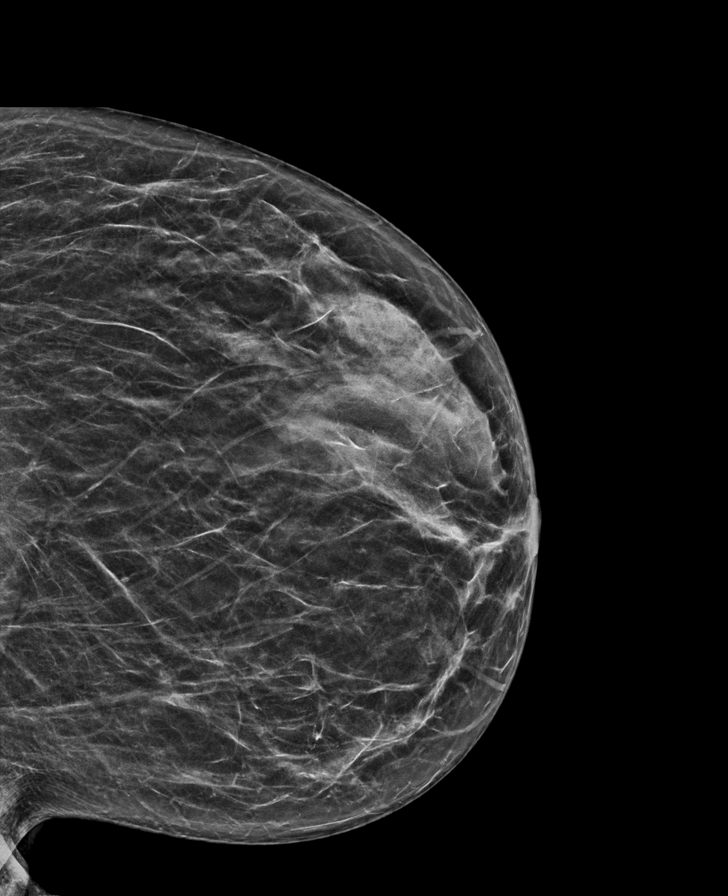

[R CC tomo · tomo slice 29/56.0]
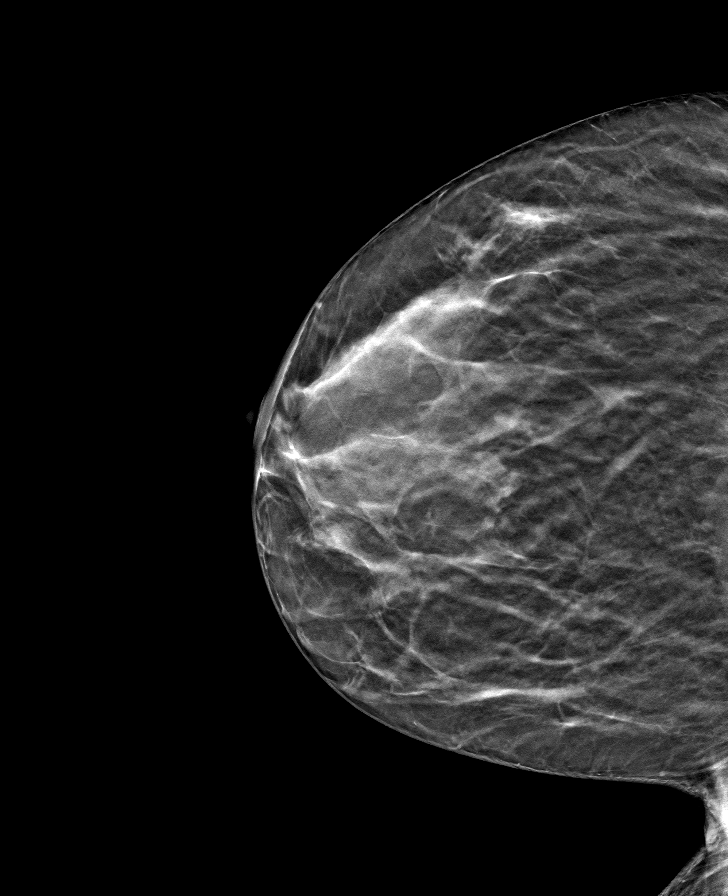

[L MLO tomo · tomo slice 31/60.0]
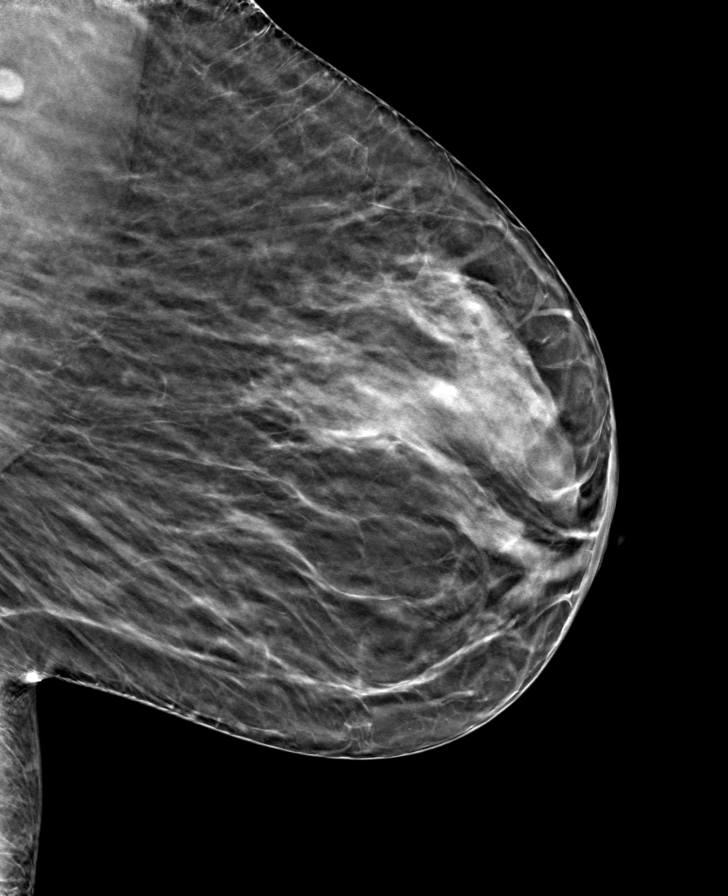

[L CC tomo · tomo slice 30/59.0]
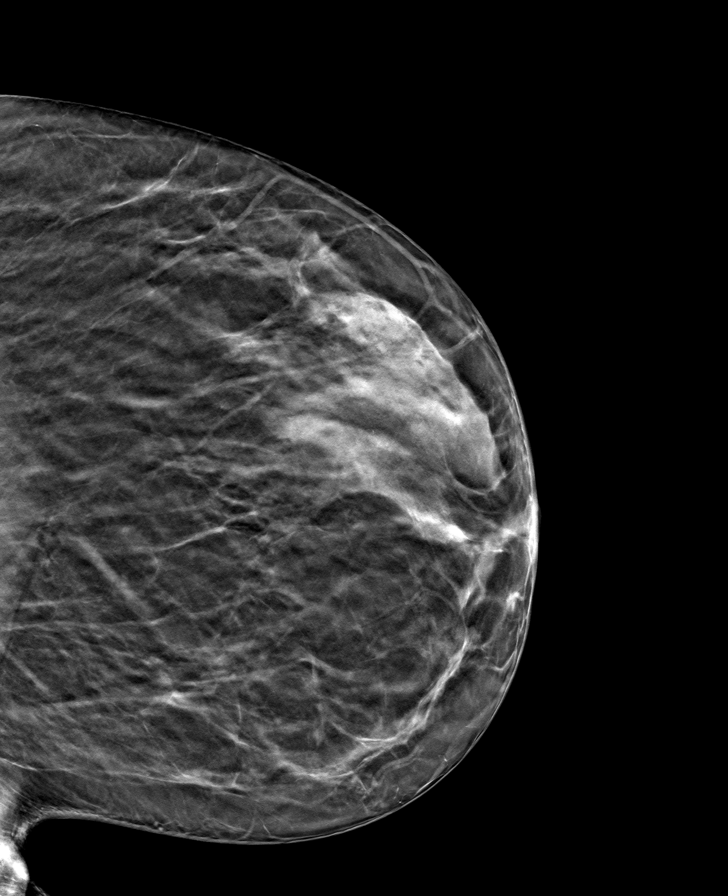

[R MLO tomo · tomo slice 29/58.0]
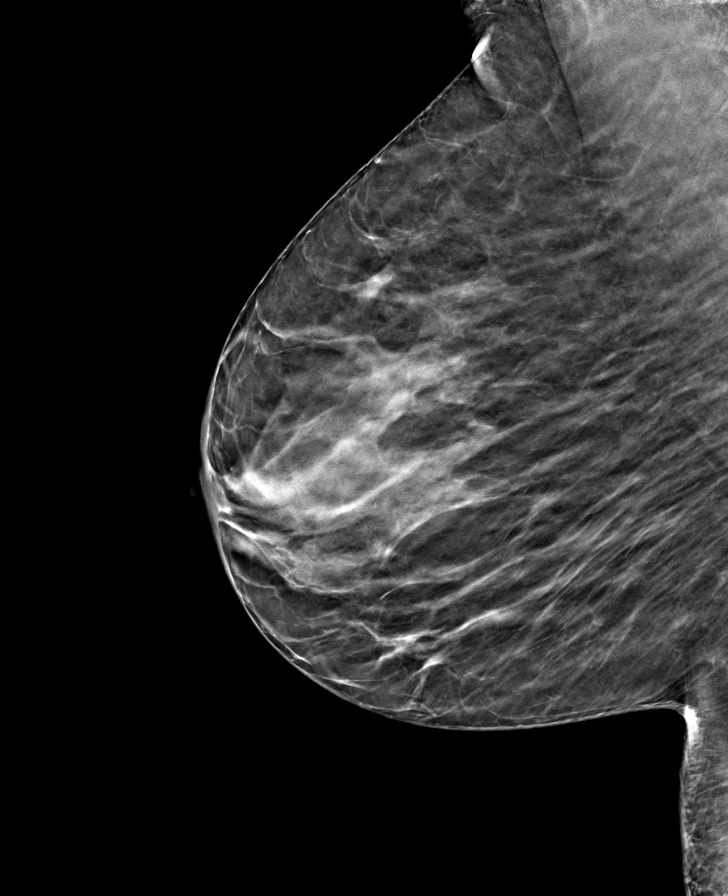

[8 of 24 positions shown; findings below may reference images not displayed]

ACR Breast Density Category b: There are scattered areas of
fibroglandular density.
FINDINGS: There are no findings suspicious for malignancy.
IMPRESSION: No mammographic evidence of malignancy. A result letter of this
screening mammogram will be mailed directly to the patient.

RECOMMENDATION:
Screening mammogram in one year. (Code:51-O-LD2)

BI-RADS CATEGORY  1: Negative.

## 2022-10-05 ENCOUNTER — Ambulatory Visit: Payer: Self-pay

## 2023-06-01 ENCOUNTER — Other Ambulatory Visit: Payer: Self-pay | Admitting: Family Medicine

## 2023-06-01 DIAGNOSIS — Z1231 Encounter for screening mammogram for malignant neoplasm of breast: Secondary | ICD-10-CM

## 2023-06-21 ENCOUNTER — Ambulatory Visit
Admission: RE | Admit: 2023-06-21 | Discharge: 2023-06-21 | Disposition: A | Discharge status: Home / Self Care | Payer: Commercial Managed Care - PPO | Source: Ambulatory Visit | Attending: Family Medicine | Admitting: Family Medicine

## 2023-06-21 DIAGNOSIS — Z1231 Encounter for screening mammogram for malignant neoplasm of breast: Secondary | ICD-10-CM | POA: Insufficient documentation

## 2023-08-24 ENCOUNTER — Ambulatory Visit (INDEPENDENT_AMBULATORY_CARE_PROVIDER_SITE_OTHER): Payer: Commercial Managed Care - PPO

## 2023-08-24 ENCOUNTER — Ambulatory Visit
Admission: EM | Admit: 2023-08-24 | Discharge: 2023-08-24 | Disposition: A | Discharge status: Home / Self Care | Payer: Commercial Managed Care - PPO | Attending: Emergency Medicine | Admitting: Emergency Medicine

## 2023-08-24 ENCOUNTER — Other Ambulatory Visit: Payer: Self-pay

## 2023-08-24 ENCOUNTER — Ambulatory Visit: Payer: Medicare Other

## 2023-08-24 DIAGNOSIS — S20214A Contusion of middle front wall of thorax, initial encounter: Secondary | ICD-10-CM | POA: Diagnosis not present

## 2023-08-24 DIAGNOSIS — M7918 Myalgia, other site: Secondary | ICD-10-CM | POA: Diagnosis not present

## 2023-08-24 DIAGNOSIS — M25512 Pain in left shoulder: Secondary | ICD-10-CM

## 2023-08-24 DIAGNOSIS — R0789 Other chest pain: Secondary | ICD-10-CM

## 2023-08-24 MED ORDER — ACETAMINOPHEN 500 MG PO TABS
1000.0000 mg | ORAL_TABLET | Freq: Once | ORAL | Status: AC
  Administered 2023-08-24: 1000 mg via ORAL

## 2023-08-24 MED ORDER — BACLOFEN 10 MG PO TABS: 10.0000 mg | ORAL_TABLET | Freq: Two times a day (BID) | ORAL | 0 refills | Status: AC
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
## 2023-08-24 NOTE — Discharge Instructions (Addendum)
You will be sore for several days. Your xrays are negative(no fracture,no dislocation). May take over the counter tylenol for pain,lidocaine patch as label directed. Take muscle relaxer for muscle spasm(baclofen). May use heat or ice 20 minutes 3 times daily for muscle aches. Please follow up with your PCP. Follow up with orthopedics if symptoms persist.  [facility]: [address] [place], [place] [address] Phone: [phone] Urgent care hours 8a-7:30p Mon-Sat
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
## 2024-05-15 ENCOUNTER — Other Ambulatory Visit: Payer: Self-pay | Admitting: Family Medicine

## 2024-05-15 DIAGNOSIS — Z1231 Encounter for screening mammogram for malignant neoplasm of breast: Secondary | ICD-10-CM

## 2024-06-21 ENCOUNTER — Encounter

## 2024-06-28 ENCOUNTER — Ambulatory Visit
Admission: RE | Admit: 2024-06-28 | Discharge: 2024-06-28 | Disposition: A | Discharge status: Home / Self Care | Source: Ambulatory Visit | Attending: Family Medicine | Admitting: Family Medicine

## 2024-06-28 ENCOUNTER — Encounter

## 2024-06-28 DIAGNOSIS — Z1231 Encounter for screening mammogram for malignant neoplasm of breast: Secondary | ICD-10-CM | POA: Diagnosis present
